# Patient Record
Sex: Female | Born: 1960 | Race: White | Hispanic: No
Health system: Southern US, Community
[De-identification: ages and names within clinical notes are randomized; demographics above are authoritative.]

---

## 1999-08-16 ENCOUNTER — Other Ambulatory Visit: Admission: RE | Admit: 1999-08-16 | Discharge: 1999-08-16 | Payer: Self-pay | Admitting: *Deleted

## 2000-11-07 ENCOUNTER — Other Ambulatory Visit: Admission: RE | Admit: 2000-11-07 | Discharge: 2000-11-07 | Payer: Self-pay | Admitting: *Deleted

## 2001-11-19 ENCOUNTER — Other Ambulatory Visit: Admission: RE | Admit: 2001-11-19 | Discharge: 2001-11-19 | Payer: Self-pay | Admitting: Family Medicine

## 2005-02-16 ENCOUNTER — Other Ambulatory Visit: Admission: RE | Admit: 2005-02-16 | Discharge: 2005-02-16 | Payer: Self-pay | Admitting: Family Medicine

## 2006-07-12 ENCOUNTER — Other Ambulatory Visit: Admission: RE | Admit: 2006-07-12 | Discharge: 2006-07-12 | Payer: Self-pay | Admitting: Family Medicine

## 2006-10-15 ENCOUNTER — Other Ambulatory Visit: Admission: RE | Admit: 2006-10-15 | Discharge: 2006-10-15 | Payer: Self-pay | Admitting: Obstetrics and Gynecology

## 2008-09-30 ENCOUNTER — Other Ambulatory Visit: Admission: RE | Admit: 2008-09-30 | Discharge: 2008-09-30 | Payer: Self-pay | Admitting: Family Medicine

## 2008-10-01 ENCOUNTER — Encounter: Admission: RE | Admit: 2008-10-01 | Discharge: 2008-10-01 | Payer: Self-pay | Admitting: Family Medicine

## 2008-10-09 ENCOUNTER — Encounter: Admission: RE | Admit: 2008-10-09 | Discharge: 2008-10-09 | Payer: Self-pay | Admitting: Family Medicine

## 2009-04-28 ENCOUNTER — Encounter: Admission: RE | Admit: 2009-04-28 | Discharge: 2009-04-28 | Payer: Self-pay | Admitting: Family Medicine

## 2009-12-24 ENCOUNTER — Encounter: Admission: RE | Admit: 2009-12-24 | Discharge: 2009-12-24 | Payer: Self-pay | Admitting: Family Medicine

## 2010-11-16 ENCOUNTER — Other Ambulatory Visit (HOSPITAL_COMMUNITY)
Admission: RE | Admit: 2010-11-16 | Discharge: 2010-11-16 | Disposition: A | Discharge status: Home / Self Care | Payer: Managed Care, Other (non HMO) | Source: Ambulatory Visit | Attending: Family Medicine | Admitting: Family Medicine

## 2010-11-16 ENCOUNTER — Other Ambulatory Visit: Payer: Self-pay | Admitting: Physician Assistant

## 2010-11-16 DIAGNOSIS — Z0000 Encounter for general adult medical examination without abnormal findings: Secondary | ICD-10-CM | POA: Insufficient documentation

## 2012-07-03 ENCOUNTER — Other Ambulatory Visit: Payer: Self-pay | Admitting: Family Medicine

## 2012-07-03 ENCOUNTER — Other Ambulatory Visit (HOSPITAL_COMMUNITY): Payer: Self-pay | Admitting: Family Medicine

## 2012-07-03 DIAGNOSIS — Z1231 Encounter for screening mammogram for malignant neoplasm of breast: Secondary | ICD-10-CM

## 2012-07-03 DIAGNOSIS — N63 Unspecified lump in unspecified breast: Secondary | ICD-10-CM

## 2012-07-18 ENCOUNTER — Ambulatory Visit
Admission: RE | Admit: 2012-07-18 | Discharge: 2012-07-18 | Disposition: A | Discharge status: Home / Self Care | Payer: Managed Care, Other (non HMO) | Source: Ambulatory Visit | Attending: Family Medicine | Admitting: Family Medicine

## 2012-07-18 DIAGNOSIS — N63 Unspecified lump in unspecified breast: Secondary | ICD-10-CM

## 2012-08-01 ENCOUNTER — Ambulatory Visit (HOSPITAL_COMMUNITY): Payer: Managed Care, Other (non HMO)

## 2014-09-22 ENCOUNTER — Other Ambulatory Visit: Payer: Self-pay

## 2014-09-22 DIAGNOSIS — Z1231 Encounter for screening mammogram for malignant neoplasm of breast: Secondary | ICD-10-CM

## 2014-09-28 ENCOUNTER — Ambulatory Visit
Admission: RE | Admit: 2014-09-28 | Discharge: 2014-09-28 | Disposition: A | Discharge status: Home / Self Care | Payer: BLUE CROSS/BLUE SHIELD | Source: Ambulatory Visit

## 2014-09-28 DIAGNOSIS — Z1231 Encounter for screening mammogram for malignant neoplasm of breast: Secondary | ICD-10-CM

## 2015-07-07 ENCOUNTER — Other Ambulatory Visit: Payer: Self-pay | Admitting: Family Medicine

## 2015-07-07 ENCOUNTER — Other Ambulatory Visit (HOSPITAL_COMMUNITY)
Admission: RE | Admit: 2015-07-07 | Discharge: 2015-07-07 | Disposition: A | Discharge status: Home / Self Care | Payer: BLUE CROSS/BLUE SHIELD | Source: Ambulatory Visit | Attending: Family Medicine | Admitting: Family Medicine

## 2015-07-07 DIAGNOSIS — Z01419 Encounter for gynecological examination (general) (routine) without abnormal findings: Secondary | ICD-10-CM | POA: Insufficient documentation

## 2015-07-08 LAB — CYTOLOGY - PAP

## 2015-12-27 DIAGNOSIS — M21611 Bunion of right foot: Secondary | ICD-10-CM | POA: Diagnosis not present

## 2015-12-27 DIAGNOSIS — M2012 Hallux valgus (acquired), left foot: Secondary | ICD-10-CM | POA: Diagnosis not present

## 2015-12-27 DIAGNOSIS — Z008 Encounter for other general examination: Secondary | ICD-10-CM | POA: Diagnosis not present

## 2015-12-27 DIAGNOSIS — L709 Acne, unspecified: Secondary | ICD-10-CM | POA: Diagnosis not present

## 2016-05-01 DIAGNOSIS — Z23 Encounter for immunization: Secondary | ICD-10-CM | POA: Diagnosis not present

## 2016-05-15 DIAGNOSIS — Z008 Encounter for other general examination: Secondary | ICD-10-CM | POA: Diagnosis not present

## 2016-05-18 DIAGNOSIS — L700 Acne vulgaris: Secondary | ICD-10-CM | POA: Diagnosis not present

## 2016-05-18 DIAGNOSIS — D225 Melanocytic nevi of trunk: Secondary | ICD-10-CM | POA: Diagnosis not present

## 2016-05-18 DIAGNOSIS — D2230 Melanocytic nevi of unspecified part of face: Secondary | ICD-10-CM | POA: Diagnosis not present

## 2016-05-18 DIAGNOSIS — Z79899 Other long term (current) drug therapy: Secondary | ICD-10-CM | POA: Diagnosis not present

## 2016-06-26 ENCOUNTER — Other Ambulatory Visit: Payer: Self-pay | Admitting: Family Medicine

## 2016-06-26 DIAGNOSIS — Z1231 Encounter for screening mammogram for malignant neoplasm of breast: Secondary | ICD-10-CM

## 2016-07-12 DIAGNOSIS — E785 Hyperlipidemia, unspecified: Secondary | ICD-10-CM | POA: Diagnosis not present

## 2016-07-12 DIAGNOSIS — Z0000 Encounter for general adult medical examination without abnormal findings: Secondary | ICD-10-CM | POA: Diagnosis not present

## 2016-07-19 ENCOUNTER — Ambulatory Visit
Admission: RE | Admit: 2016-07-19 | Discharge: 2016-07-19 | Disposition: A | Discharge status: Home / Self Care | Payer: BLUE CROSS/BLUE SHIELD | Source: Ambulatory Visit | Attending: Family Medicine | Admitting: Family Medicine

## 2016-07-19 DIAGNOSIS — Z1231 Encounter for screening mammogram for malignant neoplasm of breast: Secondary | ICD-10-CM | POA: Diagnosis not present

## 2016-10-05 DIAGNOSIS — Z713 Dietary counseling and surveillance: Secondary | ICD-10-CM | POA: Diagnosis not present

## 2016-10-09 DIAGNOSIS — L709 Acne, unspecified: Secondary | ICD-10-CM | POA: Diagnosis not present

## 2016-10-09 DIAGNOSIS — Z008 Encounter for other general examination: Secondary | ICD-10-CM | POA: Diagnosis not present

## 2016-10-16 DIAGNOSIS — L710 Perioral dermatitis: Secondary | ICD-10-CM | POA: Diagnosis not present

## 2016-12-25 DIAGNOSIS — L0390 Cellulitis, unspecified: Secondary | ICD-10-CM | POA: Diagnosis not present

## 2016-12-25 DIAGNOSIS — M799 Soft tissue disorder, unspecified: Secondary | ICD-10-CM | POA: Diagnosis not present

## 2017-01-03 DIAGNOSIS — L0390 Cellulitis, unspecified: Secondary | ICD-10-CM | POA: Diagnosis not present

## 2017-02-26 DIAGNOSIS — Z008 Encounter for other general examination: Secondary | ICD-10-CM | POA: Diagnosis not present

## 2017-05-22 DIAGNOSIS — L700 Acne vulgaris: Secondary | ICD-10-CM | POA: Diagnosis not present

## 2017-05-22 DIAGNOSIS — D225 Melanocytic nevi of trunk: Secondary | ICD-10-CM | POA: Diagnosis not present

## 2017-05-22 DIAGNOSIS — L821 Other seborrheic keratosis: Secondary | ICD-10-CM | POA: Diagnosis not present

## 2017-05-22 DIAGNOSIS — L738 Other specified follicular disorders: Secondary | ICD-10-CM | POA: Diagnosis not present

## 2017-05-23 DIAGNOSIS — Z23 Encounter for immunization: Secondary | ICD-10-CM | POA: Diagnosis not present

## 2017-08-21 ENCOUNTER — Ambulatory Visit (INDEPENDENT_AMBULATORY_CARE_PROVIDER_SITE_OTHER): Payer: BLUE CROSS/BLUE SHIELD

## 2017-08-21 ENCOUNTER — Ambulatory Visit: Payer: BLUE CROSS/BLUE SHIELD | Admitting: Podiatry

## 2017-08-21 DIAGNOSIS — M2010 Hallux valgus (acquired), unspecified foot: Secondary | ICD-10-CM | POA: Diagnosis not present

## 2017-08-21 NOTE — Patient Instructions (Addendum)
Bunion A bunion is a bump on the base of the big toe that forms when the bones of the big toe joint move out of position. Bunions may be small at first, but they often get larger over time. The can make walking painful. What are the causes? A bunion may be caused by:  Wearing narrow or pointed shoes that force the big toe to press against the other toes.  Abnormal foot development that causes the foot to roll inward (pronate).  Changes in the foot that are caused by certain diseases, such as rheumatoid arthritis and polio.  A foot injury.  What increases the risk? The following factors may make you more likely to develop this condition:  Wearing shoes that squeeze the toes together.  Having certain diseases, such as: ? Rheumatoid arthritis. ? Polio. ? Cerebral palsy.  Having family members who have bunions.  Being born with a foot deformity, such as flat feet or low arches.  Doing activities that put a lot of pressure on the feet, such as ballet dancing.  What are the signs or symptoms? The main symptom of a bunion is a noticeable bump on the big toe. Other symptoms may include:  Pain.  Swelling around the big toe.  Redness and inflammation.  Thick or hardened skin on the big toe or between the toes.  Stiffness or loss of motion in the big toe.  Trouble with walking.  How is this diagnosed? A bunion may be diagnosed based on your symptoms, medical history, and activities. You may have tests, such as:  X-rays. These allow your health care provider to check the position of the bones in your foot and look for damage to your joint. They also help your health care provider to determine the severity of your bunion and the best way to treat it.  Joint aspiration. In this test, a sample of fluid is removed from the toe joint. This test, which may be done if you are in a lot of pain, helps to rule out diseases that cause painful swelling of the joints, such as  arthritis.  How is this treated? There is no cure for a bunion, but treatment can help to prevent a bunion from getting worse. Treatment depends on the severity of your symptoms. Your health care provider may recommend:  Wearing shoes that have a wide toe box.  Using bunion pads to cushion the affected area.  Taping your toes together to keep them in a normal position.  Placing a device inside your shoe (orthotics) to help reduce pressure on your toe joint.  Taking medicine to ease pain, inflammation, and swelling.  Applying heat or ice to the affected area.  Doing stretching exercises.  Surgery to remove scar tissue and move the toes back into their normal position. This treatment is rare.  Follow these instructions at home:  Support your toe joint with proper footwear, shoe padding, or taping as told by your health care provider.  Take over-the-counter and prescription medicines only as told by your health care provider.  If directed, apply ice to the injured area: ? Put ice in a plastic bag. ? Place a towel between your skin and the bag. ? Leave the ice on for 20 minutes, 2-3 times per day.  If directed, apply heat to the affected area before you exercise. Use the heat source that your health care provider recommends, such as a moist heat pack or a heating pad. ? Place a towel between your   skin and the heat source. ? Leave the heat on for 20-30 minutes. ? Remove the heat if your skin turns bright red. This is especially important if you are unable to feel pain, heat, or cold. You may have a greater risk of getting burned.  Do exercises as told by your health care provider.  Keep all follow-up visits as told by your health care provider. Contact a health care provider if:  Your symptoms get worse.  Your symptoms do not improve in 2 weeks. Get help right away if:  You have severe pain and trouble with walking. This information is not intended to replace advice given  to you by your health care provider. Make sure you discuss any questions you have with your health care provider. Document Released: 07/10/2005 Document Revised: 12/16/2015 Document Reviewed: 02/07/2015 Elsevier Interactive Patient Education  2018 [employer]   Hammer Toe Hammer toe is a change in the shape (a deformity) of your second, third, or fourth toe. The deformity causes the middle joint of your toe to stay bent. This causes pain, especially when you are wearing shoes. Hammer toe starts gradually. At first, the toe can be straightened. Gradually over time, the deformity becomes stiff and permanent. Early treatments to keep the toe straight may relieve pain. As the deformity becomes stiff and permanent, surgery may be needed to straighten the toe. What are the causes? Hammer toe is caused by abnormal bending of the toe joint that is closest to your foot. It happens gradually over time. This pulls on the muscles and connections (tendons) of the toe joint, making them weak and stiff. It is often related to wearing shoes that are too short or narrow and do not let your toes straighten. What increases the risk? You may be at greater risk for hammer toe if you:  Are female.  Are older.  Wear shoes that are too small.  Wear high-heeled shoes that pinch your toes.  Are a ballet dancer.  Have a second toe that is longer than your big toe (first toe).  Injure your foot or toe.  Have arthritis.  Have a family history of hammer toe.  Have a nerve or muscle disorder.  What are the signs or symptoms? The main symptoms of this condition are pain and deformity of the toe. The pain is worse when wearing shoes, walking, or running. Other symptoms may include:  Corns or calluses over the bent part of the toe or between the toes.  Redness and a burning feeling on the toe.  An open sore that forms on the top of the toe.  Not being able to straighten the toe.  How is this  diagnosed? This condition is diagnosed based on your symptoms and a physical exam. During the exam, your health care provider will try to straighten your toe to see how stiff the deformity is. You may also have tests, such as:  A blood test to check for rheumatoid arthritis.  An X-ray to show how severe the deformity is.  How is this treated? Treatment for this condition will depend on how stiff the deformity is. Surgery is often needed. However, sometimes a hammer toe can be straightened without surgery. Treatments that do not involve surgery include:  Taping the toe into a straightened position.  Using pads and cushions to protect the toe (orthotics).  Wearing shoes that provide enough room for the toes.  Doing toe-stretching exercises at home.  Taking an NSAID to reduce pain and   swelling.  If these treatments do not help or the toe cannot be straightened, surgery is the next option. The most common surgeries used to straighten a hammer toe include:  Arthroplasty. In this procedure, part of the joint is removed, and that allows the toe to straighten.  Fusion. In this procedure, cartilage between the two bones of the joint is taken out and the bones are fused together into one longer bone.  Implantation. In this procedure, part of the bone is removed and replaced with an implant to let the toe move again.  Flexor tendon transfer. In this procedure, the tendons that curl the toes down (flexor tendons) are repositioned.  Follow these instructions at home:  Take over-the-counter and prescription medicines only as told by your health care provider.  Do toe straightening and stretching exercises as told by your health care provider.  Keep all follow-up visits as told by your health care provider. This is important. How is this prevented?  Wear shoes that give your toes enough room and do not cause pain.  Do not wear high-heeled shoes. Contact a health care provider if:  Your  pain gets worse.  Your toe becomes red or swollen.  You develop an open sore on your toe. This information is not intended to replace advice given to you by your health care provider. Make sure you discuss any questions you have with your health care provider. Document Released: 07/07/2000 Document Revised: 01/28/2016 Document Reviewed: 11/03/2015 Elsevier Interactive Patient Education  2018 [employer].  Pre-Operative Instructions  Congratulations, you have decided to take an important step towards improving your quality of life.  You can be assured that the doctors and staff at [facility] will be with you every step of the way.  Here are some important things you should know:  Plan to be at the surgery center/hospital at least 1 (one) hour prior to your scheduled time, unless otherwise directed by the surgical center/hospital staff.  You must have a responsible adult accompany you, remain during the surgery and drive you home.  Make sure you have directions to the surgical center/hospital to ensure you arrive on time. If you are having surgery at [facility] or [facility], you will need a copy of your medical history and physical form from your family physician within one month prior to the date of surgery. We will give you a form for your primary physician to complete.  We make every effort to accommodate the date you request for surgery.  However, there are times where surgery dates or times have to be moved.  We will contact you as soon as possible if a change in schedule is required.   No aspirin/ibuprofen for one week before surgery.  If you are on aspirin, any non-steroidal anti-inflammatory medications (Mobic, Aleve, Ibuprofen) should not be taken seven (7) days prior to your surgery.  You make take Tylenol for pain prior to surgery.  Medications - If you are taking daily heart and blood pressure medications, seizure, reflux, allergy, asthma, anxiety, pain or diabetes  medications, make sure you notify the surgery center/hospital before the day of surgery so they can tell you which medications you should take or avoid the day of surgery. No food or drink after midnight the night before surgery unless directed otherwise by surgical center/hospital staff. No alcoholic beverages 24-hours prior to surgery.  No smoking 24-hours prior or 24-hours after surgery. Wear loose pants or shorts. They should be loose enough  to fit over bandages, boots, and casts. Don't wear slip-on shoes. Sneakers are preferred. Bring your boot with you to the surgery center/hospital.  Also bring crutches or a walker if your physician has prescribed it for you.  If you do not have this equipment, it will be provided for you after surgery. If you have not been contacted by the surgery center/hospital by the day before your surgery, call to confirm the date and time of your surgery. Leave-time from work may vary depending on the type of surgery you have.  Appropriate arrangements should be made prior to surgery with your employer. Prescriptions will be provided immediately following surgery by your doctor.  Fill these as soon as possible after surgery and take the medication as directed. Pain medications will not be refilled on weekends and must be approved by the doctor. Remove nail polish on the operative foot and avoid getting pedicures prior to surgery. Wash the night before surgery.  The night before surgery wash the foot and leg well with water and the antibacterial soap provided. Be sure to pay special attention to beneath the toenails and in between the toes.  Wash for at least three (3) minutes. Rinse thoroughly with water and dry well with a towel.  Perform this wash unless told not to do so by your physician.  Enclosed: 1 Ice pack (please put in freezer the night before surgery)   1 Hibiclens skin cleaner   Pre-op instructions  If you have any questions regarding the instructions,  please do not hesitate to call our office.  [place]: [address], [place], [place] [address] -- [phone]  [place]: [address] [place], [place] [address] -- [phone]  [place]: [address]  [place], [place] [address] -- [phone]  [place]: [address], [address], [place], [place] [address] -- [phone]  Website: [email or web address]

## 2017-08-21 NOTE — Progress Notes (Signed)
   Subjective:    Patient ID: Laurie Daugherty, female    DOB: April 15, 1961, 57 y.o.   MRN: [id number]  HPI: She presents today with chief complaint of painful bunions bilaterally.  She states they have gotten to the point where she can no longer wear shoes comfortably.  She can no longer go through her day without pain.  He states that it has gotten to the point where she is altering her lifestyle because of the pain in her feet.  States that she has family predisposition for this.  Denies any trauma.  States that her left foot seems to be the worst.    Review of Systems  All other systems reviewed and are negative.      Objective:   Physical Exam: Vital signs are stable she is alert and oriented x3 no apparent distress and in a good mood.  57 year old well-nourished well-kept female.  Pulses are strongly palpable.  Neurologic sensorium is intact deep tendon reflexes are intact and symmetrical bilateral muscle strength +5/5 dorsiflexors plantar flexors inverters everters all intrinsic musculature is intact.  Orthopedic evaluation demonstrates all joints distal to the ankle full range of motion without crepitation.  She has moderate to severe bunion deformities of the bilateral foot with mild pes planus.  Very flexible forefoot reveals severe hammertoe deformity of the second digit left foot with complete dislocation dorsally and has started to dislocate medially as well.  Right foot demonstrates an even worse bunion deformity with a very high intermetatarsal angle however there is no dislocation of the second toe as of yet.  Continues to evaluation demonstrates normal supple well-hydrated cutis no openings no lesions.  Radiographs taken today demonstrate hallux abductovalgus deformity and otherwise normal foot with dislocated second toes left because of an elongated second metatarsal and then intermetatarsal angle this greater than normal value.  Right foot does demonstrate an even larger IM angle.  No  acute fractures.        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  Left being more symptomatic.  Hammertoe deformity with dislocation of the second metatarsal phalangeal joint second left.  Plan: Discussed etiology pathology conservative versus surgical therapies.  At this point we discussed surgical intervention which includes Lapidus procedure with bunionectomy second metatarsal osteotomy and hammertoe repair with screw and pin.  This will also include a cast.  We discussed nonweightbearing we discussed that she would be out of work with a nonweightbearing cast for at least 2 weeks before she can return to work with a cast and pin.  She understands this and is amenable to it highly recommended that she be out of work longer.  We provided her with information regarding the surgery center as well as anesthesia group.  Provided her with both oral and written information regarding preoperative instructions for surgery.

## 2017-08-27 DIAGNOSIS — E785 Hyperlipidemia, unspecified: Secondary | ICD-10-CM | POA: Diagnosis not present

## 2017-08-27 DIAGNOSIS — Z0000 Encounter for general adult medical examination without abnormal findings: Secondary | ICD-10-CM | POA: Diagnosis not present

## 2017-08-29 ENCOUNTER — Encounter: Payer: Self-pay | Admitting: Podiatry

## 2017-08-29 ENCOUNTER — Telehealth: Payer: Self-pay | Admitting: Podiatry

## 2017-08-29 NOTE — Telephone Encounter (Signed)
I was calling to get a copy of my x-rays from my appointment I had on 29 January. I can be reached back at work at [phone]. Thank you.

## 2017-08-29 NOTE — Telephone Encounter (Signed)
Called pt back in regards to her request for her x-rays from date of service 29 January. I informed the pt she would need to fill out and sign a release form. Pt asked me to e-mail her the form to her work e-mail at [email or web address]. I told the pt there would be a $5.00 fee that she could pay when she picked up her CD of x-rays. Pt stated she understood.

## 2017-08-29 NOTE — Progress Notes (Signed)
Requested medical records release form was e-mailed to pt at [email or web address].

## 2017-10-01 DIAGNOSIS — M205X2 Other deformities of toe(s) (acquired), left foot: Secondary | ICD-10-CM | POA: Diagnosis not present

## 2017-10-01 DIAGNOSIS — M21612 Bunion of left foot: Secondary | ICD-10-CM | POA: Diagnosis not present

## 2017-10-01 DIAGNOSIS — G5762 Lesion of plantar nerve, left lower limb: Secondary | ICD-10-CM | POA: Diagnosis not present

## 2017-10-01 DIAGNOSIS — M2042 Other hammer toe(s) (acquired), left foot: Secondary | ICD-10-CM | POA: Diagnosis not present

## 2017-12-13 DIAGNOSIS — L570 Actinic keratosis: Secondary | ICD-10-CM | POA: Diagnosis not present

## 2018-03-04 DIAGNOSIS — L989 Disorder of the skin and subcutaneous tissue, unspecified: Secondary | ICD-10-CM | POA: Diagnosis not present

## 2018-03-04 DIAGNOSIS — M21611 Bunion of right foot: Secondary | ICD-10-CM | POA: Diagnosis not present

## 2018-03-04 DIAGNOSIS — E7800 Pure hypercholesterolemia, unspecified: Secondary | ICD-10-CM | POA: Diagnosis not present

## 2018-03-04 DIAGNOSIS — M2012 Hallux valgus (acquired), left foot: Secondary | ICD-10-CM | POA: Diagnosis not present

## 2018-03-19 ENCOUNTER — Other Ambulatory Visit: Payer: Self-pay | Admitting: Family Medicine

## 2018-03-19 DIAGNOSIS — Z1231 Encounter for screening mammogram for malignant neoplasm of breast: Secondary | ICD-10-CM

## 2018-03-27 DIAGNOSIS — Z5181 Encounter for therapeutic drug level monitoring: Secondary | ICD-10-CM | POA: Diagnosis not present

## 2018-03-27 DIAGNOSIS — L570 Actinic keratosis: Secondary | ICD-10-CM | POA: Diagnosis not present

## 2018-03-27 DIAGNOSIS — L700 Acne vulgaris: Secondary | ICD-10-CM | POA: Diagnosis not present

## 2018-03-27 DIAGNOSIS — L738 Other specified follicular disorders: Secondary | ICD-10-CM | POA: Diagnosis not present

## 2018-04-18 ENCOUNTER — Ambulatory Visit
Admission: RE | Admit: 2018-04-18 | Discharge: 2018-04-18 | Disposition: A | Discharge status: Home / Self Care | Payer: BLUE CROSS/BLUE SHIELD | Source: Ambulatory Visit | Attending: Family Medicine | Admitting: Family Medicine

## 2018-04-18 DIAGNOSIS — Z1231 Encounter for screening mammogram for malignant neoplasm of breast: Secondary | ICD-10-CM | POA: Diagnosis not present

## 2018-05-06 DIAGNOSIS — Z23 Encounter for immunization: Secondary | ICD-10-CM | POA: Diagnosis not present

## 2018-05-06 DIAGNOSIS — Z008 Encounter for other general examination: Secondary | ICD-10-CM | POA: Diagnosis not present

## 2018-06-26 DIAGNOSIS — J111 Influenza due to unidentified influenza virus with other respiratory manifestations: Secondary | ICD-10-CM | POA: Diagnosis not present

## 2018-06-28 DIAGNOSIS — Z808 Family history of malignant neoplasm of other organs or systems: Secondary | ICD-10-CM | POA: Diagnosis not present

## 2018-06-28 DIAGNOSIS — L821 Other seborrheic keratosis: Secondary | ICD-10-CM | POA: Diagnosis not present

## 2018-06-28 DIAGNOSIS — D225 Melanocytic nevi of trunk: Secondary | ICD-10-CM | POA: Diagnosis not present

## 2018-06-28 DIAGNOSIS — D2230 Melanocytic nevi of unspecified part of face: Secondary | ICD-10-CM | POA: Diagnosis not present

## 2018-09-04 DIAGNOSIS — M25561 Pain in right knee: Secondary | ICD-10-CM | POA: Diagnosis not present

## 2018-09-12 ENCOUNTER — Other Ambulatory Visit: Payer: Self-pay | Admitting: Family Medicine

## 2018-09-12 ENCOUNTER — Other Ambulatory Visit (HOSPITAL_COMMUNITY)
Admission: RE | Admit: 2018-09-12 | Discharge: 2018-09-12 | Disposition: A | Discharge status: Home / Self Care | Payer: BLUE CROSS/BLUE SHIELD | Source: Ambulatory Visit | Attending: Family Medicine | Admitting: Family Medicine

## 2018-09-12 DIAGNOSIS — Z124 Encounter for screening for malignant neoplasm of cervix: Secondary | ICD-10-CM | POA: Diagnosis not present

## 2018-09-12 DIAGNOSIS — Z8249 Family history of ischemic heart disease and other diseases of the circulatory system: Secondary | ICD-10-CM | POA: Diagnosis not present

## 2018-09-12 DIAGNOSIS — E785 Hyperlipidemia, unspecified: Secondary | ICD-10-CM | POA: Diagnosis not present

## 2018-09-12 DIAGNOSIS — Z0000 Encounter for general adult medical examination without abnormal findings: Secondary | ICD-10-CM | POA: Diagnosis not present

## 2018-09-13 LAB — CYTOLOGY - PAP
Diagnosis: NEGATIVE
HPV: NOT DETECTED

## 2018-09-25 DIAGNOSIS — M1711 Unilateral primary osteoarthritis, right knee: Secondary | ICD-10-CM | POA: Diagnosis not present

## 2018-09-25 DIAGNOSIS — M25561 Pain in right knee: Secondary | ICD-10-CM | POA: Diagnosis not present

## 2018-10-15 IMAGING — MG DIGITAL SCREENING BILATERAL MAMMOGRAM WITH TOMO AND CAD
8 series · 9 of 24 positions shown · non-contrast
Comparison: Previous exam(s).

CLINICAL DATA: Screening.

EXAM:
DIGITAL SCREENING BILATERAL MAMMOGRAM WITH TOMO AND CAD

[R MLO synth-2D]
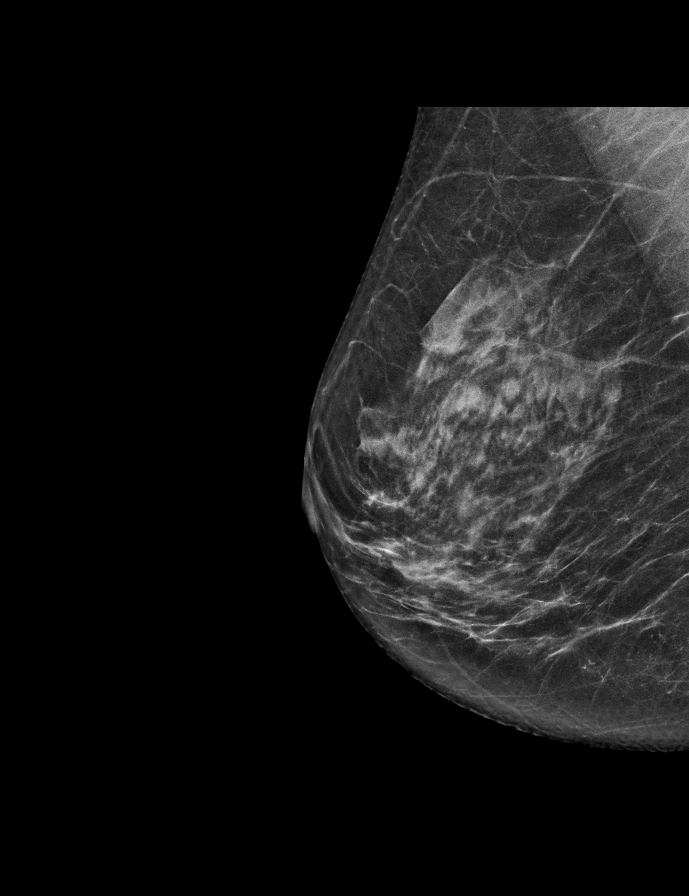

[L MLO synth-2D]
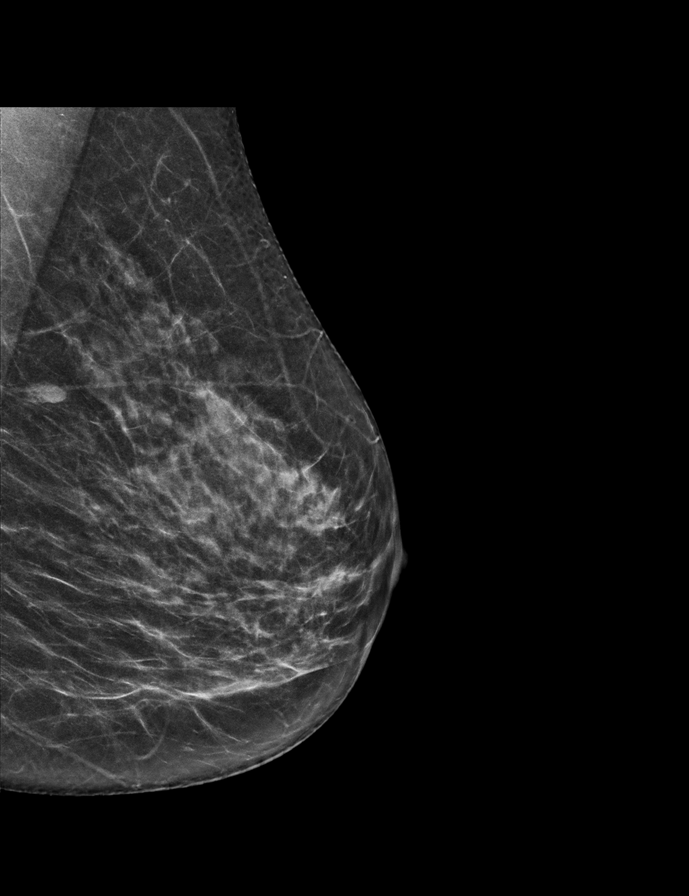

[L CC synth-2D]
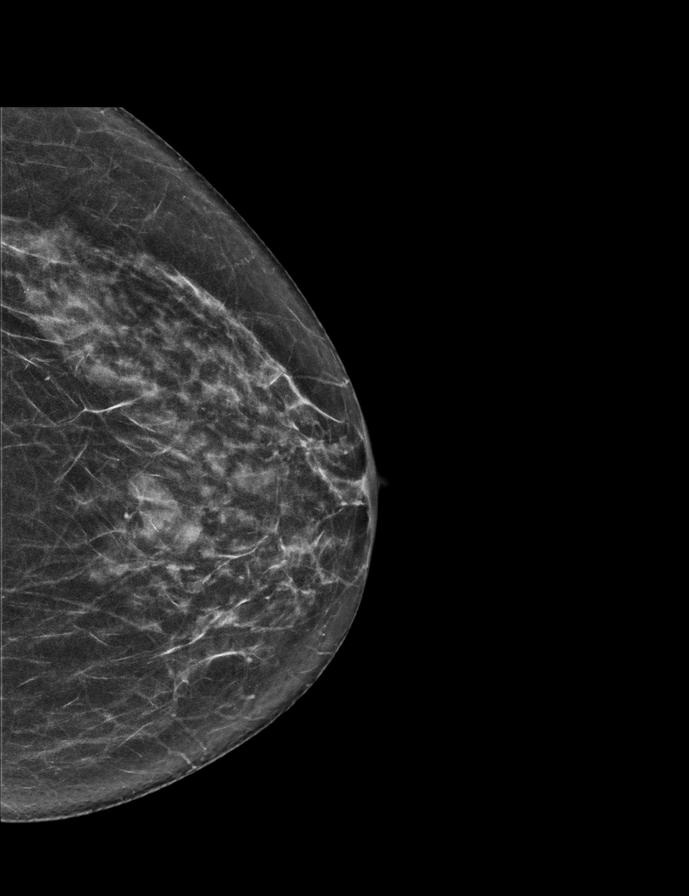

[R CC synth-2D]
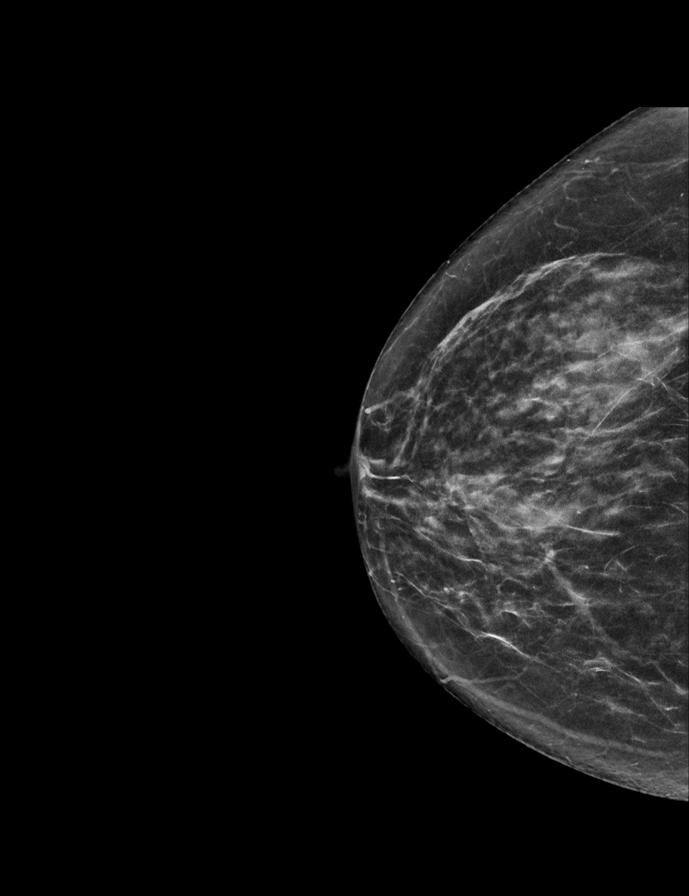

[L MLO tomo · 2 of 58 frames shown]
[frame 19/58]
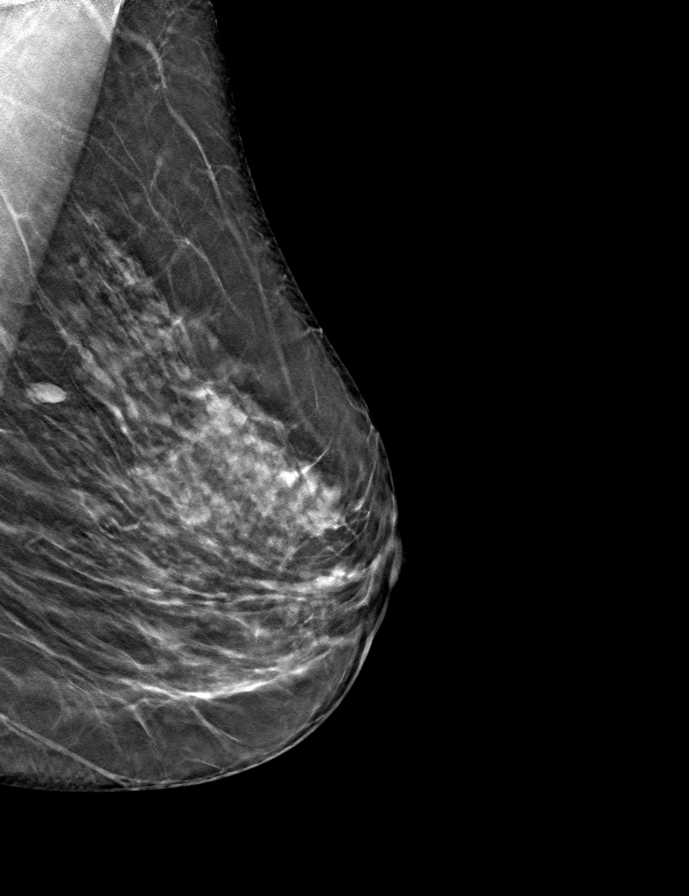
[frame 29/58]
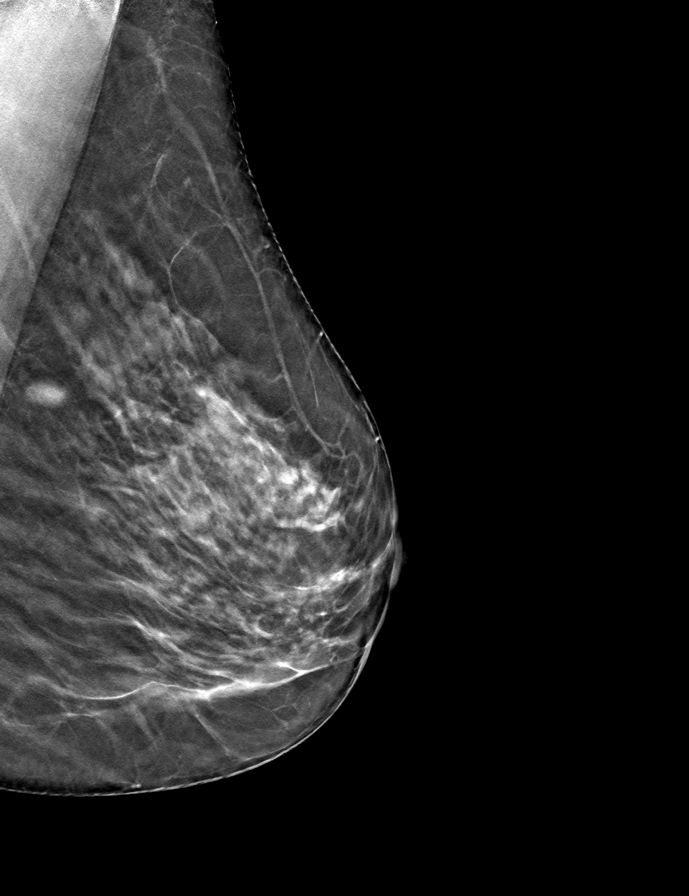

[R CC tomo · tomo slice 29/57.0]
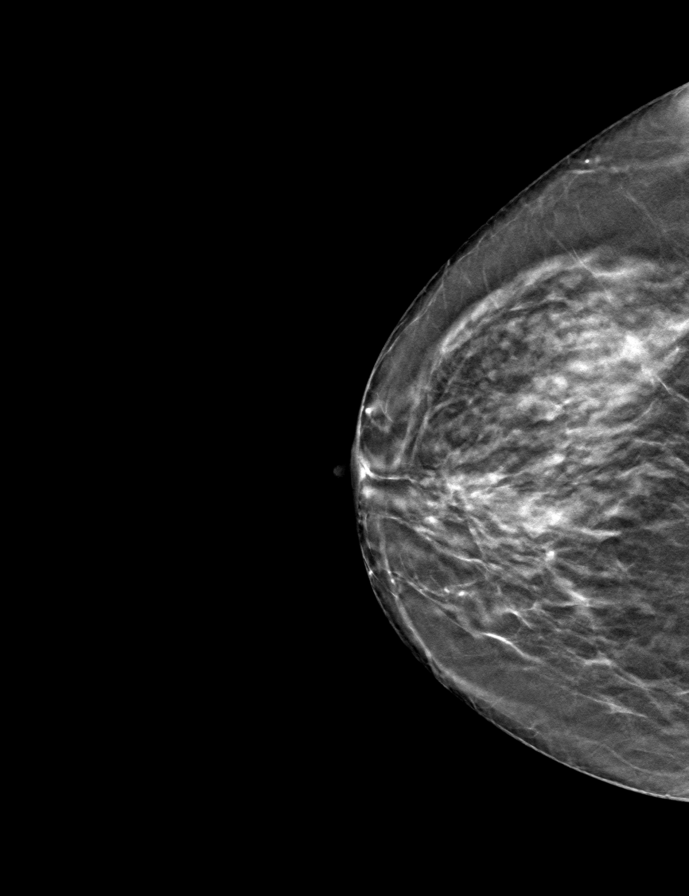

[L CC tomo · tomo slice 29/57.0]
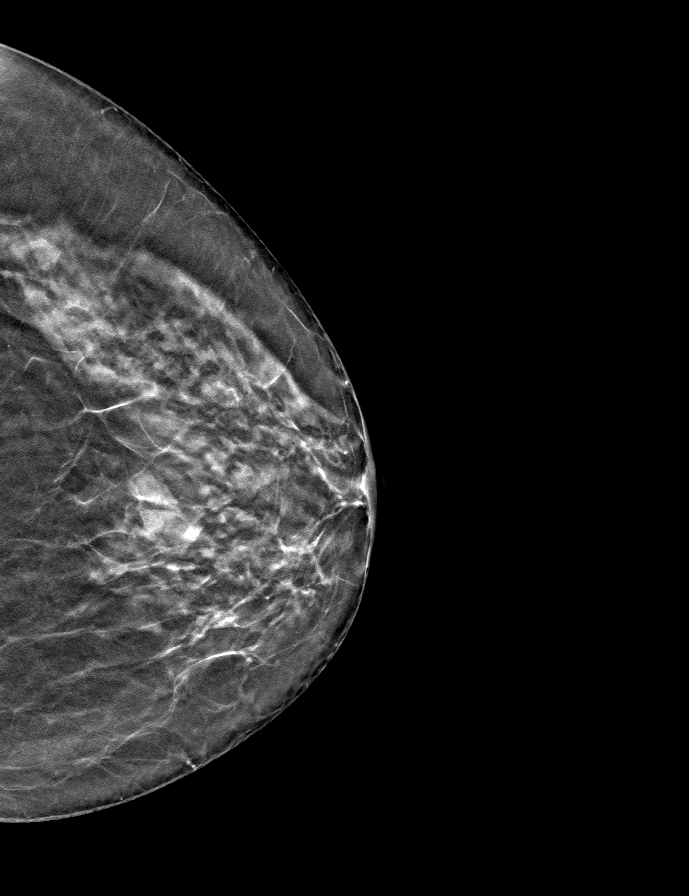

[R MLO tomo · tomo slice 31/60.0]
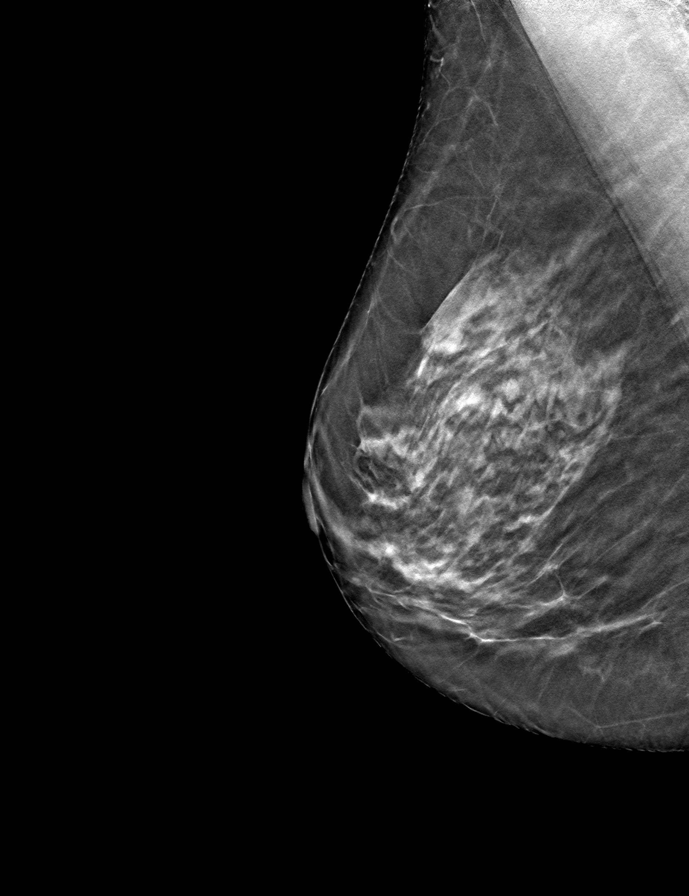

[9 of 24 positions shown; findings below may reference images not displayed]

ACR Breast Density Category c: The breast tissue is heterogeneously
dense, which may obscure small masses.
FINDINGS: There are no findings suspicious for malignancy. Images were
processed with CAD.
IMPRESSION: No mammographic evidence of malignancy. A result letter of this
screening mammogram will be mailed directly to the patient.

RECOMMENDATION:
Screening mammogram in one year. (Code:[id number]-[id number])

BI-RADS CATEGORY  1: Negative.

## 2018-12-20 DIAGNOSIS — M79672 Pain in left foot: Secondary | ICD-10-CM | POA: Diagnosis not present

## 2018-12-20 DIAGNOSIS — M79671 Pain in right foot: Secondary | ICD-10-CM | POA: Diagnosis not present

## 2018-12-20 DIAGNOSIS — M2012 Hallux valgus (acquired), left foot: Secondary | ICD-10-CM | POA: Diagnosis not present

## 2018-12-20 DIAGNOSIS — M21611 Bunion of right foot: Secondary | ICD-10-CM | POA: Diagnosis not present

## 2019-02-24 DIAGNOSIS — E7800 Pure hypercholesterolemia, unspecified: Secondary | ICD-10-CM | POA: Diagnosis not present

## 2019-02-24 DIAGNOSIS — M1990 Unspecified osteoarthritis, unspecified site: Secondary | ICD-10-CM | POA: Diagnosis not present

## 2019-03-05 DIAGNOSIS — Z79899 Other long term (current) drug therapy: Secondary | ICD-10-CM | POA: Diagnosis not present

## 2019-03-05 DIAGNOSIS — L700 Acne vulgaris: Secondary | ICD-10-CM | POA: Diagnosis not present

## 2019-05-05 DIAGNOSIS — Z23 Encounter for immunization: Secondary | ICD-10-CM | POA: Diagnosis not present

## 2019-07-30 DIAGNOSIS — L821 Other seborrheic keratosis: Secondary | ICD-10-CM | POA: Diagnosis not present

## 2019-07-30 DIAGNOSIS — D225 Melanocytic nevi of trunk: Secondary | ICD-10-CM | POA: Diagnosis not present

## 2019-07-30 DIAGNOSIS — Z808 Family history of malignant neoplasm of other organs or systems: Secondary | ICD-10-CM | POA: Diagnosis not present

## 2019-07-30 DIAGNOSIS — D2230 Melanocytic nevi of unspecified part of face: Secondary | ICD-10-CM | POA: Diagnosis not present

## 2019-09-22 ENCOUNTER — Other Ambulatory Visit: Payer: Self-pay | Admitting: Family Medicine

## 2019-09-22 DIAGNOSIS — Z1231 Encounter for screening mammogram for malignant neoplasm of breast: Secondary | ICD-10-CM

## 2019-10-08 ENCOUNTER — Ambulatory Visit
Admission: RE | Admit: 2019-10-08 | Discharge: 2019-10-08 | Disposition: A | Discharge status: Home / Self Care | Payer: BLUE CROSS/BLUE SHIELD | Source: Ambulatory Visit | Attending: Family Medicine | Admitting: Family Medicine

## 2019-10-08 ENCOUNTER — Other Ambulatory Visit: Payer: Self-pay

## 2019-10-08 DIAGNOSIS — Z1231 Encounter for screening mammogram for malignant neoplasm of breast: Secondary | ICD-10-CM | POA: Diagnosis not present

## 2019-10-15 DIAGNOSIS — Z23 Encounter for immunization: Secondary | ICD-10-CM | POA: Diagnosis not present

## 2019-10-15 DIAGNOSIS — E785 Hyperlipidemia, unspecified: Secondary | ICD-10-CM | POA: Diagnosis not present

## 2019-10-15 DIAGNOSIS — Z0000 Encounter for general adult medical examination without abnormal findings: Secondary | ICD-10-CM | POA: Diagnosis not present

## 2019-12-23 DIAGNOSIS — S0011XA Contusion of right eyelid and periocular area, initial encounter: Secondary | ICD-10-CM | POA: Diagnosis not present

## 2020-05-03 DIAGNOSIS — Z23 Encounter for immunization: Secondary | ICD-10-CM | POA: Diagnosis not present

## 2020-08-12 DIAGNOSIS — L578 Other skin changes due to chronic exposure to nonionizing radiation: Secondary | ICD-10-CM | POA: Diagnosis not present

## 2020-08-12 DIAGNOSIS — D225 Melanocytic nevi of trunk: Secondary | ICD-10-CM | POA: Diagnosis not present

## 2020-08-12 DIAGNOSIS — L821 Other seborrheic keratosis: Secondary | ICD-10-CM | POA: Diagnosis not present

## 2020-08-12 DIAGNOSIS — L700 Acne vulgaris: Secondary | ICD-10-CM | POA: Diagnosis not present

## 2020-11-02 DIAGNOSIS — Z0000 Encounter for general adult medical examination without abnormal findings: Secondary | ICD-10-CM | POA: Diagnosis not present

## 2020-11-02 DIAGNOSIS — E785 Hyperlipidemia, unspecified: Secondary | ICD-10-CM | POA: Diagnosis not present

## 2021-03-24 ENCOUNTER — Other Ambulatory Visit: Payer: Self-pay | Admitting: Family Medicine

## 2021-03-24 DIAGNOSIS — Z1231 Encounter for screening mammogram for malignant neoplasm of breast: Secondary | ICD-10-CM

## 2021-04-28 ENCOUNTER — Ambulatory Visit
Admission: RE | Admit: 2021-04-28 | Discharge: 2021-04-28 | Disposition: A | Discharge status: Home / Self Care | Payer: BC Managed Care – PPO | Source: Ambulatory Visit | Attending: Family Medicine | Admitting: Family Medicine

## 2021-04-28 ENCOUNTER — Other Ambulatory Visit: Payer: Self-pay

## 2021-04-28 DIAGNOSIS — Z1231 Encounter for screening mammogram for malignant neoplasm of breast: Secondary | ICD-10-CM | POA: Diagnosis not present

## 2021-11-12 DIAGNOSIS — N3000 Acute cystitis without hematuria: Secondary | ICD-10-CM | POA: Diagnosis not present

## 2021-11-24 DIAGNOSIS — E785 Hyperlipidemia, unspecified: Secondary | ICD-10-CM | POA: Diagnosis not present

## 2021-11-24 DIAGNOSIS — Z0000 Encounter for general adult medical examination without abnormal findings: Secondary | ICD-10-CM | POA: Diagnosis not present

## 2022-02-16 DIAGNOSIS — L700 Acne vulgaris: Secondary | ICD-10-CM | POA: Diagnosis not present

## 2022-02-16 DIAGNOSIS — D2230 Melanocytic nevi of unspecified part of face: Secondary | ICD-10-CM | POA: Diagnosis not present

## 2022-02-16 DIAGNOSIS — L821 Other seborrheic keratosis: Secondary | ICD-10-CM | POA: Diagnosis not present

## 2022-02-16 DIAGNOSIS — L578 Other skin changes due to chronic exposure to nonionizing radiation: Secondary | ICD-10-CM | POA: Diagnosis not present

## 2022-05-01 DIAGNOSIS — Z23 Encounter for immunization: Secondary | ICD-10-CM | POA: Diagnosis not present

## 2022-06-21 ENCOUNTER — Other Ambulatory Visit: Payer: Self-pay | Admitting: Family Medicine

## 2022-06-21 DIAGNOSIS — Z1231 Encounter for screening mammogram for malignant neoplasm of breast: Secondary | ICD-10-CM

## 2022-06-22 ENCOUNTER — Ambulatory Visit
Admission: RE | Admit: 2022-06-22 | Discharge: 2022-06-22 | Disposition: A | Discharge status: Home / Self Care | Payer: BC Managed Care – PPO | Source: Ambulatory Visit | Attending: Family Medicine | Admitting: Family Medicine

## 2022-06-22 DIAGNOSIS — Z1231 Encounter for screening mammogram for malignant neoplasm of breast: Secondary | ICD-10-CM

## 2022-06-23 ENCOUNTER — Other Ambulatory Visit: Payer: Self-pay | Admitting: Family Medicine

## 2022-06-23 DIAGNOSIS — R928 Other abnormal and inconclusive findings on diagnostic imaging of breast: Secondary | ICD-10-CM

## 2022-07-06 ENCOUNTER — Ambulatory Visit: Admission: RE | Admit: 2022-07-06 | Payer: BC Managed Care – PPO | Source: Ambulatory Visit

## 2022-07-06 ENCOUNTER — Ambulatory Visit
Admission: RE | Admit: 2022-07-06 | Discharge: 2022-07-06 | Disposition: A | Discharge status: Home / Self Care | Payer: BC Managed Care – PPO | Source: Ambulatory Visit | Attending: Family Medicine | Admitting: Family Medicine

## 2022-07-06 ENCOUNTER — Other Ambulatory Visit: Payer: Self-pay | Admitting: Family Medicine

## 2022-07-06 DIAGNOSIS — R928 Other abnormal and inconclusive findings on diagnostic imaging of breast: Secondary | ICD-10-CM

## 2022-07-06 DIAGNOSIS — R921 Mammographic calcification found on diagnostic imaging of breast: Secondary | ICD-10-CM

## 2022-12-05 ENCOUNTER — Ambulatory Visit
Admission: RE | Admit: 2022-12-05 | Discharge: 2022-12-05 | Disposition: A | Discharge status: Home / Self Care | Payer: BC Managed Care – PPO | Source: Ambulatory Visit | Attending: Family Medicine | Admitting: Family Medicine

## 2022-12-05 DIAGNOSIS — R921 Mammographic calcification found on diagnostic imaging of breast: Secondary | ICD-10-CM | POA: Diagnosis not present

## 2022-12-05 DIAGNOSIS — R928 Other abnormal and inconclusive findings on diagnostic imaging of breast: Secondary | ICD-10-CM

## 2022-12-20 ENCOUNTER — Other Ambulatory Visit: Payer: Self-pay | Admitting: Family Medicine

## 2022-12-20 DIAGNOSIS — R921 Mammographic calcification found on diagnostic imaging of breast: Secondary | ICD-10-CM

## 2022-12-26 DIAGNOSIS — E785 Hyperlipidemia, unspecified: Secondary | ICD-10-CM | POA: Diagnosis not present

## 2022-12-26 DIAGNOSIS — Z0000 Encounter for general adult medical examination without abnormal findings: Secondary | ICD-10-CM | POA: Diagnosis not present

## 2023-03-12 DIAGNOSIS — H18513 Endothelial corneal dystrophy, bilateral: Secondary | ICD-10-CM | POA: Diagnosis not present

## 2023-04-19 DIAGNOSIS — L570 Actinic keratosis: Secondary | ICD-10-CM | POA: Diagnosis not present

## 2023-04-19 DIAGNOSIS — L578 Other skin changes due to chronic exposure to nonionizing radiation: Secondary | ICD-10-CM | POA: Diagnosis not present

## 2023-04-19 DIAGNOSIS — L700 Acne vulgaris: Secondary | ICD-10-CM | POA: Diagnosis not present

## 2023-04-19 DIAGNOSIS — D225 Melanocytic nevi of trunk: Secondary | ICD-10-CM | POA: Diagnosis not present

## 2023-05-07 DIAGNOSIS — Z23 Encounter for immunization: Secondary | ICD-10-CM | POA: Diagnosis not present

## 2023-06-28 ENCOUNTER — Encounter: Payer: Self-pay | Admitting: Family Medicine

## 2023-06-28 ENCOUNTER — Other Ambulatory Visit: Payer: Self-pay | Admitting: Family Medicine

## 2023-06-28 DIAGNOSIS — R921 Mammographic calcification found on diagnostic imaging of breast: Secondary | ICD-10-CM

## 2023-07-24 ENCOUNTER — Ambulatory Visit
Admission: RE | Admit: 2023-07-24 | Discharge: 2023-07-24 | Disposition: A | Discharge status: Home / Self Care | Payer: BC Managed Care – PPO | Source: Ambulatory Visit | Attending: Family Medicine | Admitting: Family Medicine

## 2023-07-24 DIAGNOSIS — R921 Mammographic calcification found on diagnostic imaging of breast: Secondary | ICD-10-CM | POA: Diagnosis not present

## 2023-12-03 DIAGNOSIS — Z1211 Encounter for screening for malignant neoplasm of colon: Secondary | ICD-10-CM | POA: Diagnosis not present

## 2023-12-03 DIAGNOSIS — K5730 Diverticulosis of large intestine without perforation or abscess without bleeding: Secondary | ICD-10-CM | POA: Diagnosis not present

## 2024-01-28 DIAGNOSIS — E785 Hyperlipidemia, unspecified: Secondary | ICD-10-CM | POA: Diagnosis not present

## 2024-01-28 DIAGNOSIS — Z124 Encounter for screening for malignant neoplasm of cervix: Secondary | ICD-10-CM | POA: Diagnosis not present

## 2024-01-28 DIAGNOSIS — Z23 Encounter for immunization: Secondary | ICD-10-CM | POA: Diagnosis not present

## 2024-01-28 DIAGNOSIS — Z0000 Encounter for general adult medical examination without abnormal findings: Secondary | ICD-10-CM | POA: Diagnosis not present

## 2024-01-29 ENCOUNTER — Other Ambulatory Visit (HOSPITAL_BASED_OUTPATIENT_CLINIC_OR_DEPARTMENT_OTHER): Payer: Self-pay | Admitting: Family Medicine

## 2024-01-29 DIAGNOSIS — Z13820 Encounter for screening for osteoporosis: Secondary | ICD-10-CM

## 2024-03-26 DIAGNOSIS — H00025 Hordeolum internum left lower eyelid: Secondary | ICD-10-CM | POA: Diagnosis not present

## 2024-04-08 DIAGNOSIS — H00025 Hordeolum internum left lower eyelid: Secondary | ICD-10-CM | POA: Diagnosis not present

## 2024-05-08 DIAGNOSIS — L820 Inflamed seborrheic keratosis: Secondary | ICD-10-CM | POA: Diagnosis not present

## 2024-05-08 DIAGNOSIS — D2230 Melanocytic nevi of unspecified part of face: Secondary | ICD-10-CM | POA: Diagnosis not present

## 2024-05-08 DIAGNOSIS — Z808 Family history of malignant neoplasm of other organs or systems: Secondary | ICD-10-CM | POA: Diagnosis not present

## 2024-05-08 DIAGNOSIS — L821 Other seborrheic keratosis: Secondary | ICD-10-CM | POA: Diagnosis not present

## 2024-05-08 DIAGNOSIS — D225 Melanocytic nevi of trunk: Secondary | ICD-10-CM | POA: Diagnosis not present

## 2024-07-30 ENCOUNTER — Other Ambulatory Visit: Payer: Self-pay | Admitting: Family Medicine

## 2024-07-30 DIAGNOSIS — R921 Mammographic calcification found on diagnostic imaging of breast: Secondary | ICD-10-CM

## 2024-08-22 ENCOUNTER — Encounter

## 2024-08-27 ENCOUNTER — Encounter

## 2024-09-03 ENCOUNTER — Other Ambulatory Visit (HOSPITAL_BASED_OUTPATIENT_CLINIC_OR_DEPARTMENT_OTHER)

## 2024-09-05 ENCOUNTER — Encounter
